# Patient Record
Sex: Male | Born: 1970 | Race: White | Hispanic: No | Marital: Married | Smoking: Former smoker
Health system: Southern US, Community
[De-identification: ages and names within clinical notes are randomized; demographics above are authoritative.]

## PROBLEM LIST (undated history)

## (undated) DIAGNOSIS — F329 Major depressive disorder, single episode, unspecified: Secondary | ICD-10-CM

## (undated) DIAGNOSIS — F419 Anxiety disorder, unspecified: Secondary | ICD-10-CM

## (undated) DIAGNOSIS — F32A Depression, unspecified: Secondary | ICD-10-CM

## (undated) DIAGNOSIS — Z720 Tobacco use: Secondary | ICD-10-CM

## (undated) DIAGNOSIS — E291 Testicular hypofunction: Secondary | ICD-10-CM

## (undated) DIAGNOSIS — E785 Hyperlipidemia, unspecified: Secondary | ICD-10-CM

## (undated) DIAGNOSIS — E7800 Pure hypercholesterolemia, unspecified: Secondary | ICD-10-CM

## (undated) HISTORY — DX: Testicular hypofunction: E29.1

## (undated) HISTORY — DX: Major depressive disorder, single episode, unspecified: F32.9

## (undated) HISTORY — DX: Hyperlipidemia, unspecified: E78.5

## (undated) HISTORY — DX: Tobacco use: Z72.0

## (undated) HISTORY — DX: Anxiety disorder, unspecified: F41.9

## (undated) HISTORY — DX: Depression, unspecified: F32.A

## (undated) HISTORY — DX: Pure hypercholesterolemia, unspecified: E78.00

---

## 2006-01-08 ENCOUNTER — Ambulatory Visit (HOSPITAL_COMMUNITY): Admission: RE | Admit: 2006-01-08 | Discharge: 2006-01-08 | Payer: Self-pay | Admitting: Gastroenterology

## 2009-11-23 ENCOUNTER — Encounter: Admission: RE | Admit: 2009-11-23 | Discharge: 2009-11-23 | Payer: Self-pay | Admitting: Family Medicine

## 2010-10-14 ENCOUNTER — Emergency Department (HOSPITAL_COMMUNITY): Payer: BC Managed Care – PPO

## 2010-10-14 ENCOUNTER — Emergency Department (HOSPITAL_COMMUNITY)
Admission: EM | Admit: 2010-10-14 | Discharge: 2010-10-14 | Disposition: A | Discharge status: Home / Self Care | Payer: BC Managed Care – PPO | Attending: Emergency Medicine | Admitting: Emergency Medicine

## 2010-10-14 DIAGNOSIS — S300XXA Contusion of lower back and pelvis, initial encounter: Secondary | ICD-10-CM | POA: Insufficient documentation

## 2010-10-14 DIAGNOSIS — S22009A Unspecified fracture of unspecified thoracic vertebra, initial encounter for closed fracture: Secondary | ICD-10-CM | POA: Insufficient documentation

## 2010-10-14 DIAGNOSIS — M545 Low back pain, unspecified: Secondary | ICD-10-CM | POA: Insufficient documentation

## 2010-10-14 DIAGNOSIS — E785 Hyperlipidemia, unspecified: Secondary | ICD-10-CM | POA: Insufficient documentation

## 2010-10-14 DIAGNOSIS — W108XXA Fall (on) (from) other stairs and steps, initial encounter: Secondary | ICD-10-CM | POA: Insufficient documentation

## 2010-10-14 DIAGNOSIS — Y92009 Unspecified place in unspecified non-institutional (private) residence as the place of occurrence of the external cause: Secondary | ICD-10-CM | POA: Insufficient documentation

## 2012-11-10 ENCOUNTER — Emergency Department (HOSPITAL_COMMUNITY)
Admission: EM | Admit: 2012-11-10 | Discharge: 2012-11-10 | Disposition: A | Discharge status: Home / Self Care | Payer: BC Managed Care – PPO | Attending: Emergency Medicine | Admitting: Emergency Medicine

## 2012-11-10 ENCOUNTER — Emergency Department (HOSPITAL_COMMUNITY): Payer: BC Managed Care – PPO

## 2012-11-10 ENCOUNTER — Encounter (HOSPITAL_COMMUNITY): Payer: Self-pay | Admitting: *Deleted

## 2012-11-10 DIAGNOSIS — S6992XA Unspecified injury of left wrist, hand and finger(s), initial encounter: Secondary | ICD-10-CM

## 2012-11-10 DIAGNOSIS — Y998 Other external cause status: Secondary | ICD-10-CM | POA: Insufficient documentation

## 2012-11-10 DIAGNOSIS — Y939 Activity, unspecified: Secondary | ICD-10-CM | POA: Insufficient documentation

## 2012-11-10 DIAGNOSIS — Z79899 Other long term (current) drug therapy: Secondary | ICD-10-CM | POA: Insufficient documentation

## 2012-11-10 DIAGNOSIS — S6980XA Other specified injuries of unspecified wrist, hand and finger(s), initial encounter: Secondary | ICD-10-CM | POA: Insufficient documentation

## 2012-11-10 DIAGNOSIS — Z23 Encounter for immunization: Secondary | ICD-10-CM | POA: Insufficient documentation

## 2012-11-10 DIAGNOSIS — S6990XA Unspecified injury of unspecified wrist, hand and finger(s), initial encounter: Secondary | ICD-10-CM | POA: Insufficient documentation

## 2012-11-10 DIAGNOSIS — Y929 Unspecified place or not applicable: Secondary | ICD-10-CM | POA: Insufficient documentation

## 2012-11-10 DIAGNOSIS — W268XXA Contact with other sharp object(s), not elsewhere classified, initial encounter: Secondary | ICD-10-CM | POA: Insufficient documentation

## 2012-11-10 MED ORDER — HYDROCODONE-ACETAMINOPHEN 5-325 MG PO TABS: 1.0000 | ORAL_TABLET | ORAL | Status: DC | PRN

## 2012-11-10 MED ORDER — TETANUS-DIPHTH-ACELL PERTUSSIS 5-2.5-18.5 LF-MCG/0.5 IM SUSP
0.5000 mL | Freq: Once | INTRAMUSCULAR | Status: AC
  Administered 2012-11-10: 0.5 mL via INTRAMUSCULAR
  Filled 2012-11-10: qty 0.5

## 2012-11-10 MED ORDER — CEPHALEXIN 500 MG PO CAPS: 500.0000 mg | ORAL_CAPSULE | Freq: Four times a day (QID) | ORAL | Status: DC

## 2012-11-10 NOTE — ED Notes (Signed)
Pt in stating he jammed something under his left middle finger nail, states pain has continued and is unsure if something is stuck under his nail

## 2012-11-10 NOTE — ED Provider Notes (Signed)
History     CSN: [id number]  Arrival date & time 11/10/12  4098   First MD Initiated Contact with Patient 11/10/12 2207      Chief Complaint  Patient presents with  . Finger Injury    (Consider location/radiation/quality/duration/timing/severity/associated sxs/prior treatment) Patient is a 42 y.o. male presenting with hand pain. The history is provided by the patient. No language interpreter was used.  Hand Pain This is a new problem. The current episode started today. Pertinent negatives include no fever or numbness. Associated symptoms comments: Wooden foreign body lodged under finger nail of left hand earlier today. He was able to pull a piece out but was concerned there was remnant material left. .    History reviewed. No pertinent past medical history.  History reviewed. No pertinent past surgical history.  History reviewed. No pertinent family history.  History  Substance Use Topics  . Smoking status: Not on file  . Smokeless tobacco: Not on file  . Alcohol Use: Not on file      Review of Systems  Constitutional: Negative for fever.  Musculoskeletal:        See HPI.  Skin: Negative.   Neurological: Negative for numbness.    Allergies  Review of patient's allergies indicates no known allergies.  Home Medications   Current Outpatient Rx  Name  Route  Sig  Dispense  Refill  . DULoxetine (CYMBALTA) 60 MG capsule   Oral   Take 60 mg by mouth daily.         . rosuvastatin (CRESTOR) 10 MG tablet   Oral   Take 10 mg by mouth daily.         [name] Kitchen testosterone (ANDROGEL) 50 MG/5GM GEL   Transdermal   Place 10 g onto the skin daily.           BP 132/93  Pulse 102  Temp(Src) 98.4 F (36.9 C) (Oral)  Resp 20  Wt 210 lb (95.255 kg)  SpO2 100%  Physical Exam  Constitutional: He appears well-developed and well-nourished. No distress.  Musculoskeletal:  Left middle finger nail intact. Lateral 1/3 nail discolored - hematoma vs FB. No bleeding. No  finger lesion or laceration. FROM.  Skin: Skin is warm and dry.    ED Course  Procedures (including critical care time)  Labs Reviewed - No data to display Dg Finger Middle Left  11/10/2012   *RADIOLOGY REPORT*  Clinical Data: Injury  LEFT MIDDLE FINGER 2+V  Comparison: None.  Findings: Negative for fracture.  No focal arthropathy.  No radiopaque foreign body.  IMPRESSION: Negative   Original Report Authenticated By: Janeece Riggers, M.D.   Procedure:  Left middle finger numbed via digital block with adequate anesthesia. Nail was lifted but not removed along discoloration and irrigated. No foreign body observed.   No diagnosis found.  1. Left finger injury.  MDM  Discussed the possibility of residual foreign body under the nail and need for hand follow up if there is any sign of infection. Will cover with Keflex. Tetanus updated.         Arnoldo Hooker, PA-C 11/10/12 2323

## 2012-11-13 NOTE — ED Provider Notes (Signed)
Medical screening examination/treatment/procedure(s) were performed by non-physician practitioner and as supervising physician I was immediately available for consultation/collaboration.  Zaydan Papesh, MD 11/13/12 0925 

## 2015-02-18 ENCOUNTER — Ambulatory Visit: Payer: Self-pay | Admitting: Internal Medicine

## 2015-02-18 DIAGNOSIS — F32A Depression, unspecified: Secondary | ICD-10-CM | POA: Insufficient documentation

## 2015-02-18 DIAGNOSIS — E7800 Pure hypercholesterolemia, unspecified: Secondary | ICD-10-CM | POA: Insufficient documentation

## 2015-02-18 DIAGNOSIS — E785 Hyperlipidemia, unspecified: Secondary | ICD-10-CM | POA: Insufficient documentation

## 2015-02-18 DIAGNOSIS — Z720 Tobacco use: Secondary | ICD-10-CM | POA: Insufficient documentation

## 2015-02-18 DIAGNOSIS — F329 Major depressive disorder, single episode, unspecified: Secondary | ICD-10-CM | POA: Insufficient documentation

## 2015-02-18 DIAGNOSIS — E291 Testicular hypofunction: Secondary | ICD-10-CM | POA: Insufficient documentation

## 2015-02-18 DIAGNOSIS — F419 Anxiety disorder, unspecified: Secondary | ICD-10-CM | POA: Insufficient documentation

## 2017-01-18 ENCOUNTER — Other Ambulatory Visit: Payer: Self-pay | Admitting: Cardiology

## 2017-01-18 DIAGNOSIS — Z8249 Family history of ischemic heart disease and other diseases of the circulatory system: Secondary | ICD-10-CM

## 2017-01-22 ENCOUNTER — Ambulatory Visit
Admission: RE | Admit: 2017-01-22 | Discharge: 2017-01-22 | Disposition: A | Discharge status: Home / Self Care | Payer: No Typology Code available for payment source | Source: Ambulatory Visit | Attending: Cardiology | Admitting: Cardiology

## 2017-01-22 DIAGNOSIS — Z8249 Family history of ischemic heart disease and other diseases of the circulatory system: Secondary | ICD-10-CM

## 2018-02-11 ENCOUNTER — Encounter (HOSPITAL_COMMUNITY): Payer: Self-pay | Admitting: Emergency Medicine

## 2018-02-11 ENCOUNTER — Emergency Department (HOSPITAL_COMMUNITY)
Admission: EM | Admit: 2018-02-11 | Discharge: 2018-02-11 | Disposition: A | Discharge status: Home / Self Care | Payer: 59 | Attending: Emergency Medicine | Admitting: Emergency Medicine

## 2018-02-11 DIAGNOSIS — E860 Dehydration: Secondary | ICD-10-CM | POA: Insufficient documentation

## 2018-02-11 DIAGNOSIS — F17210 Nicotine dependence, cigarettes, uncomplicated: Secondary | ICD-10-CM | POA: Diagnosis not present

## 2018-02-11 DIAGNOSIS — R197 Diarrhea, unspecified: Secondary | ICD-10-CM | POA: Insufficient documentation

## 2018-02-11 DIAGNOSIS — Z79899 Other long term (current) drug therapy: Secondary | ICD-10-CM | POA: Diagnosis not present

## 2018-02-11 DIAGNOSIS — R55 Syncope and collapse: Secondary | ICD-10-CM | POA: Insufficient documentation

## 2018-02-11 LAB — CBC
HEMATOCRIT: 47 % (ref 39.0–52.0)
Hemoglobin: 15.5 g/dL (ref 13.0–17.0)
MCH: 31.7 pg (ref 26.0–34.0)
MCHC: 33 g/dL (ref 30.0–36.0)
MCV: 96.1 fL (ref 78.0–100.0)
Platelets: 355 10*3/uL (ref 150–400)
RBC: 4.89 MIL/uL (ref 4.22–5.81)
RDW: 13 % (ref 11.5–15.5)
WBC: 18.1 10*3/uL — AB (ref 4.0–10.5)

## 2018-02-11 LAB — BASIC METABOLIC PANEL
Anion gap: 10 (ref 5–15)
BUN: 13 mg/dL (ref 6–20)
CHLORIDE: 105 mmol/L (ref 98–111)
CO2: 21 mmol/L — AB (ref 22–32)
Calcium: 9.4 mg/dL (ref 8.9–10.3)
Creatinine, Ser: 1.2 mg/dL (ref 0.61–1.24)
GFR calc Af Amer: >60 mL/min (ref >60)
GFR calc non Af Amer: >60 mL/min (ref >60)
Glucose, Bld: 114 mg/dL — ABNORMAL HIGH (ref 70–99)
POTASSIUM: 4.2 mmol/L (ref 3.5–5.1)
SODIUM: 136 mmol/L (ref 135–145)

## 2018-02-11 LAB — HEPATIC FUNCTION PANEL
ALT: 26 U/L (ref 0–44)
AST: 25 U/L (ref 15–41)
Albumin: 3.9 g/dL (ref 3.5–5.0)
Alkaline Phosphatase: 76 U/L (ref 38–126)
Bilirubin, Direct: <0.1 mg/dL (ref 0.0–0.2)
Total Bilirubin: 0.7 mg/dL (ref 0.3–1.2)
Total Protein: 7.4 g/dL (ref 6.5–8.1)

## 2018-02-11 LAB — URINALYSIS, ROUTINE W REFLEX MICROSCOPIC
BACTERIA UA: NONE SEEN
BILIRUBIN URINE: NEGATIVE
Glucose, UA: NEGATIVE mg/dL
KETONES UR: NEGATIVE mg/dL
Leukocytes, UA: NEGATIVE
Nitrite: NEGATIVE
PROTEIN: NEGATIVE mg/dL
Specific Gravity, Urine: 1.006 (ref 1.005–1.030)
pH: 5 (ref 5.0–8.0)

## 2018-02-11 LAB — CBG MONITORING, ED: GLUCOSE-CAPILLARY: 103 mg/dL — AB (ref 70–99)

## 2018-02-11 MED ORDER — SODIUM CHLORIDE 0.9 % IV BOLUS (SEPSIS)
1000.0000 mL | Freq: Once | INTRAVENOUS | Status: AC
  Administered 2018-02-11: 1000 mL via INTRAVENOUS

## 2018-02-11 NOTE — ED Provider Notes (Signed)
TIME SEEN: 4:38 AM  CHIEF COMPLAINT: Syncope, dehydration  HPI: Patient is a 46 year old male with history of hyperlipidemia who presents to the emergency department after syncopal event.  States for the past day he has not felt well and has had multiple episodes of watery diarrhea.  No nausea or vomiting but has had decreased oral intake secondary to not feeling well.  States he thinks he has a "stomach bug".  No abdominal pain.  States tonight he went to the bathroom and passed out briefly.  Did not hit his head.  No chest pain or shortness of breath.  States he thinks he is dehydrated.  No sick contacts or recent travel.  ROS: See HPI Constitutional: no fever  Eyes: no drainage  ENT: no runny nose   Cardiovascular:  no chest pain  Resp: no SOB  GI: no vomiting GU: no dysuria Integumentary: no rash  Allergy: no hives  Musculoskeletal: no leg swelling  Neurological: no slurred speech ROS otherwise negative  PAST MEDICAL HISTORY/PAST SURGICAL HISTORY:  Past Medical History:  Diagnosis Date  . Anxiety   . Depression   . Hypercholesteremia   . Hyperlipidemia   . Hypogonadism, male   . Tobacco abuse     MEDICATIONS:  Prior to Admission medications   Medication Sig Start Date End Date Taking? Authorizing Provider  cephALEXin (KEFLEX) 500 MG capsule Take 1 capsule (500 mg total) by mouth 4 (four) times daily. 11/10/12   Elpidio Anis, PA-C  DULoxetine (CYMBALTA) 60 MG capsule Take 60 mg by mouth daily.    [provider]  HYDROcodone-acetaminophen (NORCO/VICODIN) 5-325 MG per tablet Take 1 tablet by mouth every 4 (four) hours as needed for pain. 11/10/12   Elpidio Anis, PA-C  rosuvastatin (CRESTOR) 10 MG tablet Take 10 mg by mouth daily.    [provider]  testosterone (ANDROGEL) 50 MG/5GM GEL Place 10 g onto the skin daily.    [provider]    ALLERGIES:  No Known Allergies  SOCIAL HISTORY:  Social History   Tobacco Use  . Smoking status:  Current Every Day Smoker    Types: Cigarettes  . Smokeless tobacco: Never Used  Substance Use Topics  . Alcohol use: Yes    Comment: RARE    FAMILY HISTORY: Family History  Problem Relation Age of Onset  . Hypercholesterolemia Mother   . Hypertension Mother   . Hypertension Father   . Hypercholesterolemia Father   . Heart disease Father   . Heart attack Maternal Grandfather 50    EXAM: BP 121/73 (BP Location: Right Arm)   Pulse 89   Temp 98.4 F (36.9 C) (Oral)   Resp 18   Ht 6\' 1"  (1.854 m)   Wt 99.8 kg   SpO2 100%   BMI 29.03 kg/m  CONSTITUTIONAL: Alert and oriented and responds appropriately to questions. Well-appearing; well-nourished HEAD: Normocephalic, atraumatic  eYES: Conjunctivae clear, pupils appear equal, EOMI ENT: normal nose; moist mucous membranes NECK: Supple, no meningismus, no nuchal rigidity, no LAD  CARD: RRR; S1 and S2 appreciated; no murmurs, no clicks, no rubs, no gallops RESP: Normal chest excursion without splinting or tachypnea; breath sounds clear and equal bilaterally; no wheezes, no rhonchi, no rales, no hypoxia or respiratory distress, speaking full sentences ABD/GI: Normal bowel sounds; non-distended; soft, non-tender, no rebound, no guarding, no peritoneal signs, no hepatosplenomegaly BACK:  The back appears normal and is non-tender to palpation, there is no CVA tenderness EXT: Normal ROM in all joints; non-tender  to palpation; no edema; normal capillary refill; no cyanosis, no calf tenderness or swelling    SKIN: Normal color for age and race; warm; no rash NEURO: Moves all extremities equally, no facial asymmetry, normal speech PSYCH: The patient's mood and manner are appropriate. Grooming and personal hygiene are appropriate.  MEDICAL DECISION MAKING: Patient here after syncopal event.  Likely from dehydration.  Abdominal exam is benign.  Had no preceding chest pain or shortness of breath.  EKG shows no arrhythmia, ischemic change or  interval abnormality.  Will obtain labs today to check electrolytes.  Will give IV fluids.  He has received 1 L with EMS.  ED PROGRESS: Labs show leukocytosis but no other acute abnormality.  Patient reports feeling "much better".  Able to ambulate without difficulty and tolerate p.o. Fluids.   Suspect viral illness.  I feel he is safe to be discharged home.  Recommended increase fluid intake at home.   At this time, I do not feel there is any life-threatening condition present. I have reviewed and discussed all results (EKG, imaging, lab, urine as appropriate) and exam findings with patient/family. I have reviewed nursing notes and appropriate previous records.  I feel the patient is safe to be discharged home without further emergent workup and can continue workup as an outpatient as needed. Discussed usual and customary return precautions. Patient/family verbalize understanding and are comfortable with this plan.  Outpatient follow-up has been provided if needed. All questions have been answered.      EKG Interpretation  Date/Time:  Monday February 11 2018 04:15:21 EDT Ventricular Rate:  85 PR Interval:  162 QRS Duration: 80 QT Interval:  356 QTC Calculation: 423 R Axis:   52 Text Interpretation:  Normal sinus rhythm Normal ECG No old tracing to compare Confirmed by Ward, Baxter HireKristen [phone] on 02/11/2018 4:38:21 AM         Ward, Layla MawKristen N, DO 02/11/18 [id number]

## 2018-02-11 NOTE — Discharge Instructions (Signed)
You may use over-the-counter Imodium as needed for diarrhea.  I recommend that you drink 80 - 100 ounces of water and electrolyte replenishing fluids such as Gatorade, Powerade every day.

## 2018-02-11 NOTE — ED Notes (Signed)
Patient ambulated to the bathroom unassisted.  

## 2018-02-11 NOTE — ED Triage Notes (Signed)
Pt arrived via EMS from home, per EMS pt reports over 24 hours for any water intake, pt reports multiple episodes of diarrhea in last 24 hours, pt reports getting up to go to BR, felt dizzy slid down wall, pts wife found him with eyes open but unresponsive, incontinent of stool.  Pt was able to get up and clean himself. IV est, of NS bolus given by EMS.  A & O.

## 2019-08-22 ENCOUNTER — Ambulatory Visit: Payer: BC Managed Care – PPO | Attending: Internal Medicine

## 2019-08-22 DIAGNOSIS — Z23 Encounter for immunization: Secondary | ICD-10-CM

## 2019-08-22 NOTE — Progress Notes (Signed)
   Covid-19 Vaccination Clinic  Name:  Duane Green    MRN: [id number] DOB: Jan 22, 1971  08/22/2019  Mr. Duane Green was observed post Covid-19 immunization for 15 minutes without incident. He was provided with Vaccine Information Sheet and instruction to access the V-Safe system.   Mr. Duane Green was instructed to call 911 with any severe reactions post vaccine: [name] Kitchen Difficulty breathing  . Swelling of face and throat  . A fast heartbeat  . A bad rash all over body  . Dizziness and weakness   Immunizations Administered    Name Date Dose VIS Date Route   Pfizer COVID-19 Vaccine 08/22/2019  8:56 AM 0.3 mL 05/09/2019 Intramuscular   Manufacturer: [employer], [employer]   Lot: [id number]   NDC: [address]-3

## 2019-09-16 ENCOUNTER — Ambulatory Visit: Payer: BC Managed Care – PPO | Attending: Internal Medicine

## 2019-09-16 ENCOUNTER — Ambulatory Visit: Payer: BC Managed Care – PPO

## 2019-09-16 DIAGNOSIS — Z23 Encounter for immunization: Secondary | ICD-10-CM

## 2019-09-16 NOTE — Progress Notes (Signed)
   Covid-19 Vaccination Clinic  Name:  Duane Green    MRN: [id number] DOB: 1971-02-06  09/16/2019  Duane Green was observed post Covid-19 immunization for 15 minutes without incident. He was provided with Vaccine Information Sheet and instruction to access the V-Safe system.   Duane Green was instructed to call 911 with any severe reactions post vaccine: [name] Kitchen Difficulty breathing  . Swelling of face and throat  . A fast heartbeat  . A bad rash all over body  . Dizziness and weakness   Immunizations Administered    Name Date Dose VIS Date Route   Pfizer COVID-19 Vaccine 09/16/2019  2:23 PM 0.3 mL 07/23/2018 Intramuscular   Manufacturer: [employer], [employer]   Lot: [id number]   NDC: [address]-8

## 2019-11-02 ENCOUNTER — Encounter: Payer: Self-pay | Admitting: *Deleted

## 2019-11-02 ENCOUNTER — Other Ambulatory Visit: Payer: Self-pay

## 2019-11-02 ENCOUNTER — Ambulatory Visit (INDEPENDENT_AMBULATORY_CARE_PROVIDER_SITE_OTHER): Payer: BC Managed Care – PPO

## 2019-11-02 ENCOUNTER — Ambulatory Visit
Admission: EM | Admit: 2019-11-02 | Discharge: 2019-11-02 | Disposition: A | Discharge status: Home / Self Care | Payer: BC Managed Care – PPO | Attending: Emergency Medicine | Admitting: Emergency Medicine

## 2019-11-02 DIAGNOSIS — S51812A Laceration without foreign body of left forearm, initial encounter: Secondary | ICD-10-CM

## 2019-11-02 DIAGNOSIS — M25532 Pain in left wrist: Secondary | ICD-10-CM | POA: Diagnosis not present

## 2019-11-02 DIAGNOSIS — Z23 Encounter for immunization: Secondary | ICD-10-CM | POA: Diagnosis not present

## 2019-11-02 DIAGNOSIS — W540XXA Bitten by dog, initial encounter: Secondary | ICD-10-CM

## 2019-11-02 MED ORDER — AMOXICILLIN-POT CLAVULANATE 875-125 MG PO TABS: 1.0000 | ORAL_TABLET | Freq: Two times a day (BID) | ORAL | 0 refills | Status: AC

## 2019-11-02 MED ORDER — TETANUS-DIPHTH-ACELL PERTUSSIS 5-2.5-18.5 LF-MCG/0.5 IM SUSP
0.5000 mL | Freq: Once | INTRAMUSCULAR | Status: AC
  Administered 2019-11-02: 0.5 mL via INTRAMUSCULAR

## 2019-11-02 NOTE — Discharge Instructions (Signed)
Keep wound clean, dry, covered. Take antibiotic twice daily with food. Do not put Neosporin on it as this can degrade your sutures. Return in 1 week for suture removal: To replace. Return sooner if parents worsening pain, swelling, redness, discharge, fever.

## 2019-11-02 NOTE — ED Triage Notes (Signed)
Pt reports breaking up a fight between his dog and his brother's dog approx 1 hr ago, sustaining 2 puncture wounds and small lacerations to left wrist/forearm area; bleeding controlled.  LUE CMS intact.  Pt states perpetrator dog is up to date on rabies vaccine.

## 2019-11-02 NOTE — ED Provider Notes (Signed)
[facility]    CSN: [id number] Arrival date & time: 11/02/19  1438      History   Chief Complaint Chief Complaint  Patient presents with  . Animal Bite    HPI Duane Green is a 49 y.o. male with history of tobacco use presenting for left forearm puncture wounds s/p dog bite.  Patient states he was breaking up a fight between his dog and his brother's dog approximately 1 hour PTA.  Brother's dog bite left forearm: approx 50 lb, UTD on vaccines.  Sustained 2 puncture wounds to left distal forearm.   Past Medical History:  Diagnosis Date  . Anxiety   . Depression   . Hypercholesteremia   . Hyperlipidemia   . Hypogonadism, male   . Tobacco abuse     Patient Active Problem List   Diagnosis Date Noted  . Hyperlipidemia   . Hypercholesteremia   . Depression   . Anxiety   . Tobacco abuse   . Hypogonadism, male     History reviewed. No pertinent surgical history.     Home Medications    Prior to Admission medications   Medication Sig Start Date End Date Taking? Authorizing Provider  DULoxetine (CYMBALTA) 60 MG capsule Take 60 mg by mouth daily.   Yes [provider]  rosuvastatin (CRESTOR) 10 MG tablet Take 10 mg by mouth daily.   Yes [provider]  amoxicillin-clavulanate (AUGMENTIN) 875-125 MG tablet Take 1 tablet by mouth every 12 (twelve) hours. 11/02/19   Hall-Potvin, [place], PA-C  testosterone (ANDROGEL) 50 MG/5GM GEL Place 10 g onto the skin daily.    [provider]    Family History Family History  Problem Relation Age of Onset  . Hypercholesterolemia Mother   . Hypertension Mother   . Hypertension Father   . Hypercholesterolemia Father   . Heart disease Father   . Stroke Father   . Heart attack Maternal Grandfather 50  . Heart attack Brother     Social History Social History   Tobacco Use  . Smoking status: Former Smoker    Types: Cigarettes  . Smokeless tobacco: Never Used  Substance Use Topics  .  Alcohol use: Yes    Comment: RARE  . Drug use: Never     Allergies   Patient has no known allergies.   Review of Systems As per HPI   Physical Exam Triage Vital Signs ED Triage Vitals  Enc Vitals Group     BP 11/02/19 1451 109/73     Pulse Rate 11/02/19 1451 90     Resp 11/02/19 1451 16     Temp 11/02/19 1451 98.1 F (36.7 C)     Temp Source 11/02/19 1451 Oral     SpO2 11/02/19 1451 97 %     Weight --      Height --      Head Circumference --      Peak Flow --      Pain Score 11/02/19 1452 4     Pain Loc --      Pain Edu? --      Excl. in GC? --    No data found.  Updated Vital Signs BP 109/73   Pulse 90   Temp 98.1 F (36.7 C) (Oral)   Resp 16   SpO2 97%   Visual Acuity Right Eye Distance:   Left Eye Distance:   Bilateral Distance:    Right Eye Near:   Left Eye Near:  Bilateral Near:     Physical Exam Constitutional:      General: He is not in acute distress. HENT:     Head: Normocephalic and atraumatic.  Eyes:     General: No scleral icterus.    Pupils: Pupils are equal, round, and reactive to light.  Cardiovascular:     Rate and Rhythm: Normal rate.  Pulmonary:     Effort: Pulmonary effort is normal. No respiratory distress.     Breath sounds: No wheezing.  Musculoskeletal:        General: Swelling and tenderness present. Normal range of motion.     Comments: Left UE NVI  Skin:    Coloration: Skin is not jaundiced or pale.     Comments: 2 punctate wounds to distal aspect of left forearm.  Neurological:     Mental Status: He is alert and oriented to person, place, and time.      UC Treatments / Results  Labs (all labs ordered are listed, but only abnormal results are displayed) Labs Reviewed - No data to display  EKG   Radiology DG Wrist Complete Left  Result Date: 11/02/2019 CLINICAL DATA:  LEFT wrist and distal forearm pain post dog bite EXAM: LEFT WRIST - COMPLETE 3+ VIEW COMPARISON:  None FINDINGS: Soft tissue swelling at  radial and dorsal aspects of distal forearm. Foci of soft tissue gas at ulnar aspect of distal forearm. Osseous mineralization normal. Joint spaces preserved. No acute fracture, dislocation, or bone destruction. No radiopaque foreign bodies. IMPRESSION: No acute osseous abnormalities. Electronically Signed   By: Lavonia Dana M.D.   On: 11/02/2019 15:34    Procedures Laceration Repair  Date/Time: 11/02/2019 3:49 PM Performed by: Quincy Sheehan, PA-C Authorized by: Quincy Sheehan, PA-C   Consent:    Consent obtained:  Verbal   Consent given by:  Patient   Risks discussed:  Infection, need for additional repair, pain, poor cosmetic result and poor wound healing   Alternatives discussed:  No treatment and delayed treatment Universal protocol:    Patient identity confirmed:  Verbally with patient Anesthesia (see MAR for exact dosages):    Anesthesia method:  Local infiltration   Local anesthetic:  Lidocaine 1% w/o epi Laceration details:    Wound length (cm): 2 lacerations, each 5 mm.   Laceration depth: 2 lacerations, each 5 mm. Repair type:    Repair type:  Simple Pre-procedure details:    Preparation:  Patient was prepped and draped in usual sterile fashion Exploration:    Hemostasis achieved with:  Direct pressure   Wound exploration: wound explored through full range of motion     Contaminated: no   Treatment:    Area cleansed with:  Soap and water   Amount of cleaning:  Standard   Irrigation solution:  Tap water   Irrigation method:  Pressure wash Skin repair:    Repair method:  Sutures   Suture size:  5-0   Suture material:  Prolene   Number of sutures: 2 lacerations, 1 suture each. Approximation:    Approximation:  Close Post-procedure details:    Dressing:  Bulky dressing   Patient tolerance of procedure:  Tolerated well, no immediate complications   (including critical care time)  Medications Ordered in UC Medications  Tdap (BOOSTRIX) injection 0.5  mL (0.5 mLs Intramuscular Given 11/02/19 1520)    Initial Impression / Assessment and Plan / UC Course  I have reviewed the triage vital signs and the nursing notes.  Pertinent labs &  imaging results that were available during my care of the patient were reviewed by me and considered in my medical decision making (see chart for details).     Afebrile, nontoxic, hemodynamically stable in office. No chest pain, difficulty breathing. No wound contamination. X-ray done office, reviewed by me radiology: negative for fracture.  Wound irrigated, 2 sutures placed which patient tolerated well. Reviewed wound care, will start Augmentin given deep punctate wound from dog bite, and have patient return in 1 week for suture removal.  Return precautions discussed, patient verbalized understanding and is agreeable to plan. Final Clinical Impressions(s) / UC Diagnoses   Final diagnoses:  Dog bite, initial encounter  Laceration of left forearm, initial encounter     Discharge Instructions     Keep wound clean, dry, covered. Take antibiotic twice daily with food. Do not put Neosporin on it as this can degrade your sutures. Return in 1 week for suture removal: To replace. Return sooner if parents worsening pain, swelling, redness, discharge, fever.    ED Prescriptions    Medication Sig Dispense Auth. Provider   amoxicillin-clavulanate (AUGMENTIN) 875-125 MG tablet Take 1 tablet by mouth every 12 (twelve) hours. 14 tablet Hall-Potvin, [place], PA-C     I have reviewed the PDMP during this encounter.   Hall-Potvin, [place], [place] 11/02/19 1550

## 2019-11-10 ENCOUNTER — Ambulatory Visit
Admission: EM | Admit: 2019-11-10 | Discharge: 2019-11-10 | Disposition: A | Discharge status: Home / Self Care | Payer: BC Managed Care – PPO

## 2019-11-10 DIAGNOSIS — S51812D Laceration without foreign body of left forearm, subsequent encounter: Secondary | ICD-10-CM

## 2021-12-22 IMAGING — DX DG WRIST COMPLETE 3+V*L*
4 series · 4 of 4 positions shown · non-contrast
Comparison: None

CLINICAL DATA: LEFT wrist and distal forearm pain post dog bite

EXAM:
LEFT WRIST - COMPLETE 3+ VIEW

[wrist pa (1 of 3)]
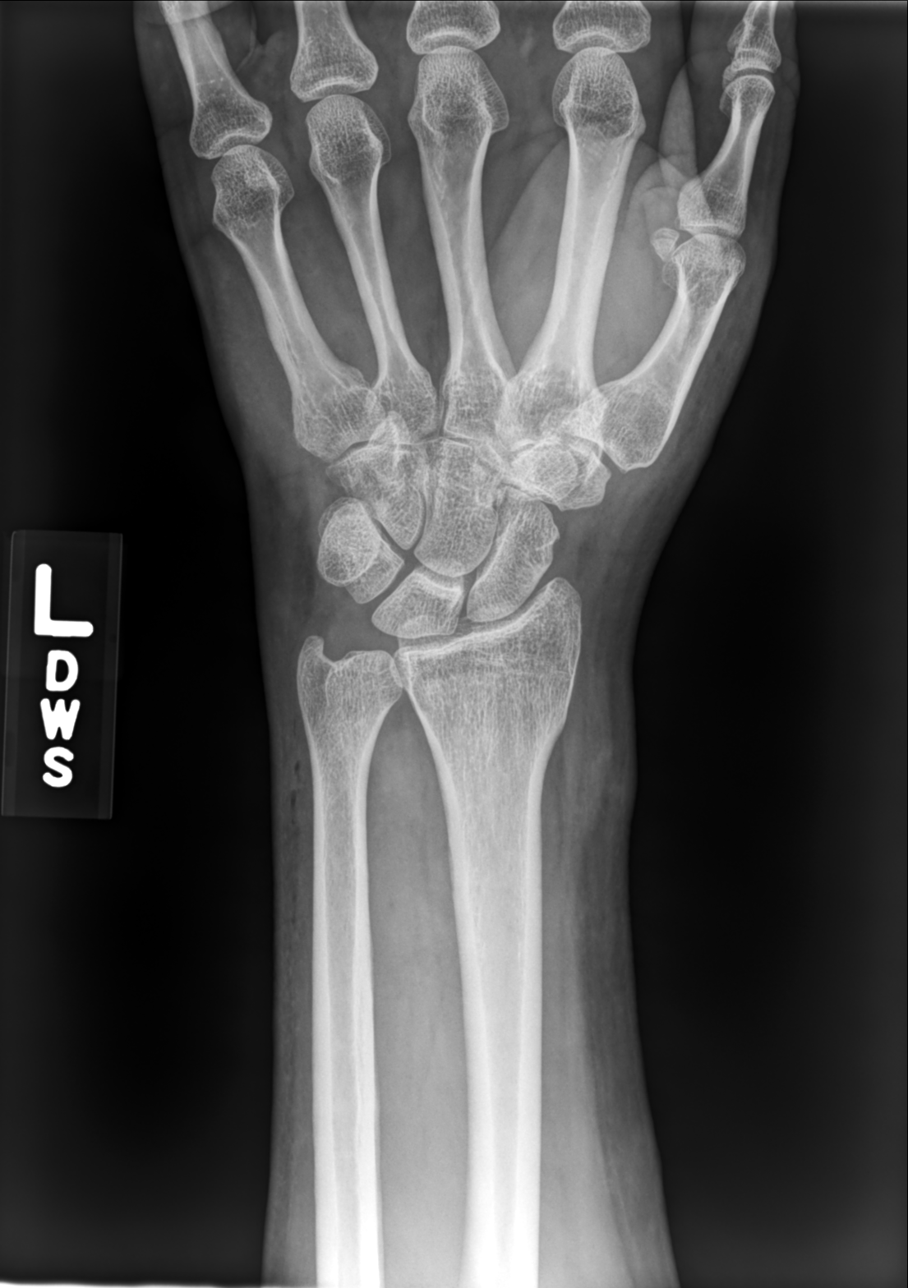

[wrist pa (2 of 3)]
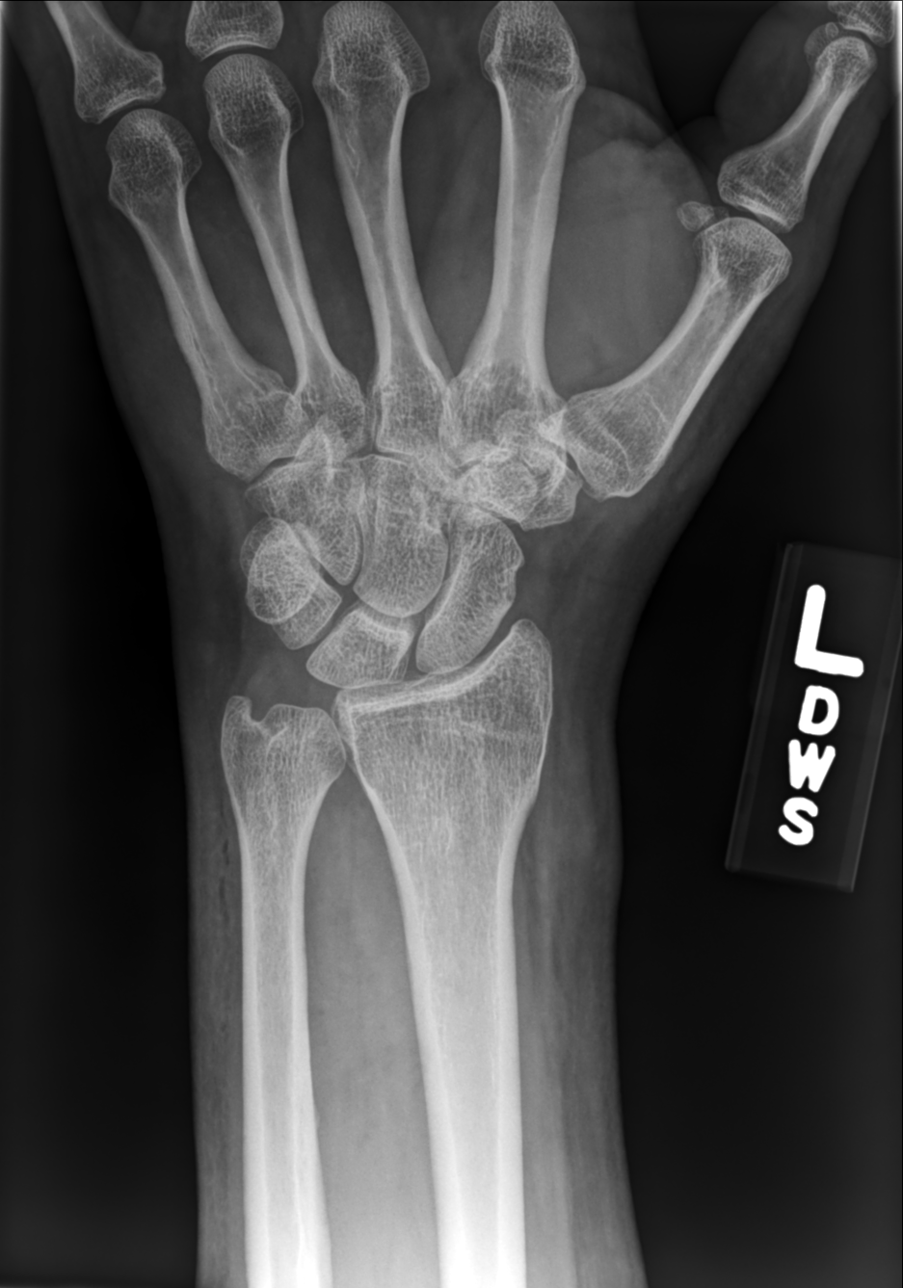

[wrist pa (3 of 3)]
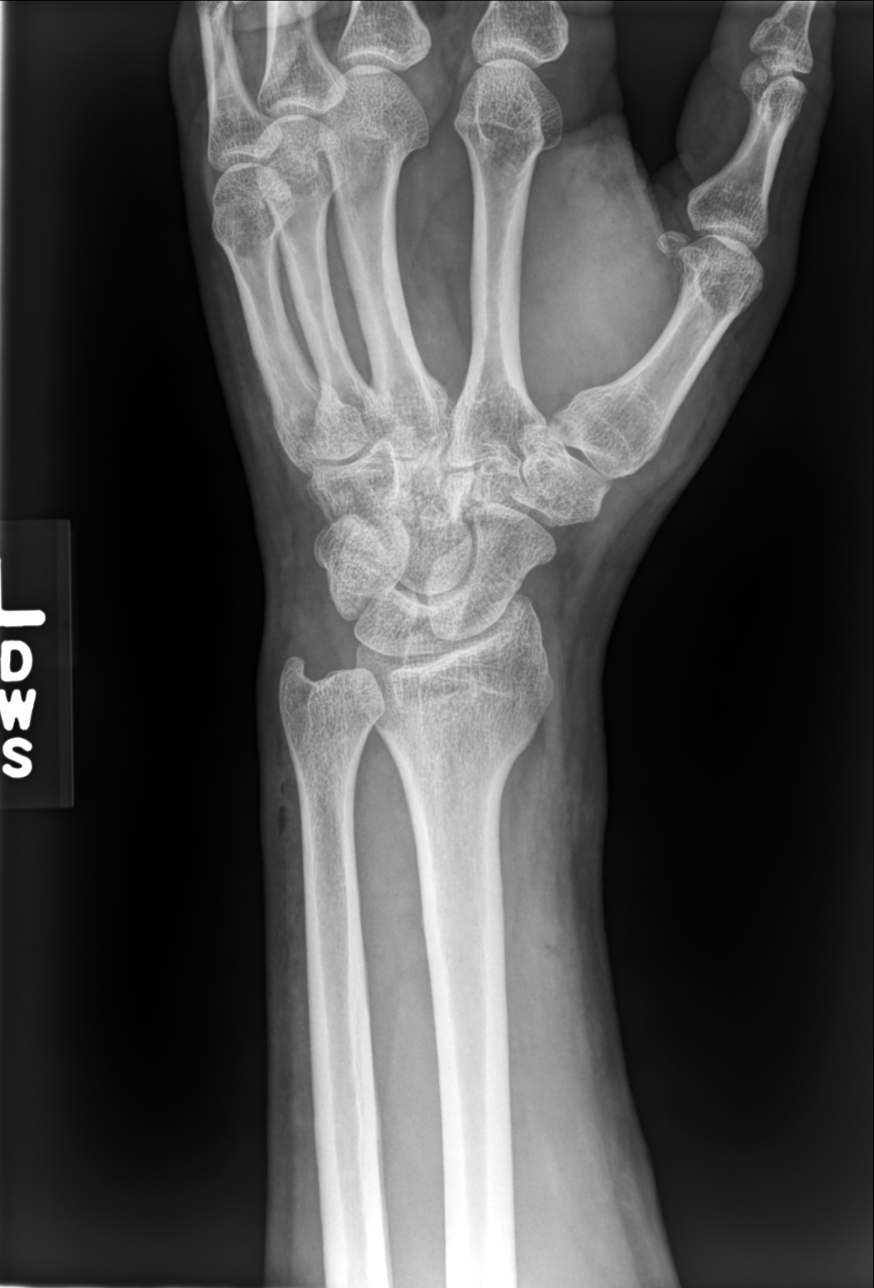

[wrist lat]
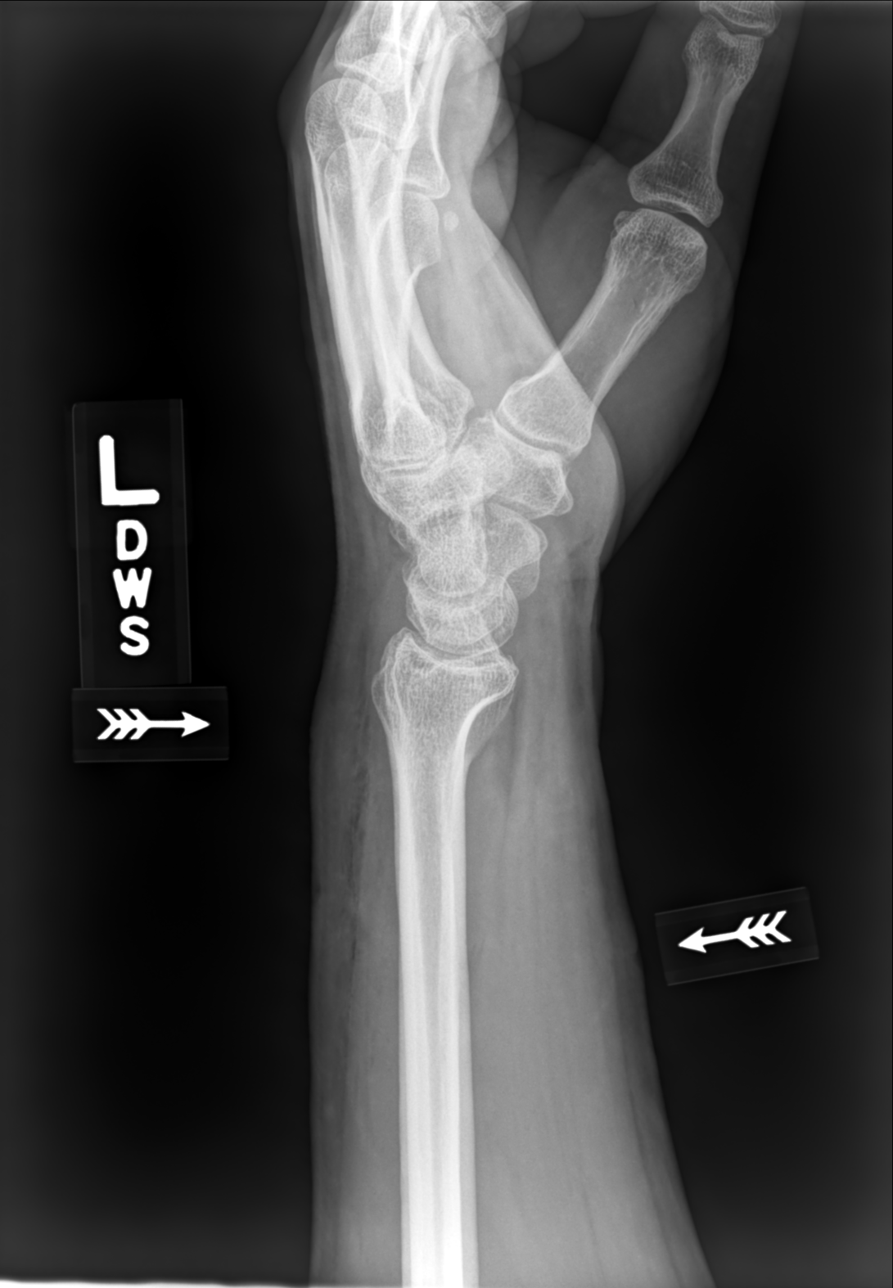

[4 of 4 positions shown; findings below may reference images not displayed]

FINDINGS: Soft tissue swelling at radial and dorsal aspects of distal forearm.

Foci of soft tissue gas at ulnar aspect of distal forearm.

Osseous mineralization normal.

Joint spaces preserved.

No acute fracture, dislocation, or bone destruction.

No radiopaque foreign bodies.
IMPRESSION: No acute osseous abnormalities.
